# Patient Record
Sex: Male | Born: 1975
Health system: Southern US, Community
[De-identification: ages and names within clinical notes are randomized; demographics above are authoritative.]

## PROBLEM LIST (undated history)

## (undated) DIAGNOSIS — Z87442 Personal history of urinary calculi: Secondary | ICD-10-CM

## (undated) DIAGNOSIS — F419 Anxiety disorder, unspecified: Secondary | ICD-10-CM

## (undated) HISTORY — PX: NO PAST SURGERIES: SHX2092

---

## 2015-05-08 DIAGNOSIS — F410 Panic disorder [episodic paroxysmal anxiety] without agoraphobia: Secondary | ICD-10-CM | POA: Diagnosis not present

## 2015-05-08 DIAGNOSIS — Z209 Contact with and (suspected) exposure to unspecified communicable disease: Secondary | ICD-10-CM | POA: Diagnosis not present

## 2015-05-08 DIAGNOSIS — F411 Generalized anxiety disorder: Secondary | ICD-10-CM | POA: Diagnosis not present

## 2015-06-08 DIAGNOSIS — N50811 Right testicular pain: Secondary | ICD-10-CM | POA: Diagnosis not present

## 2015-06-08 DIAGNOSIS — R1030 Lower abdominal pain, unspecified: Secondary | ICD-10-CM | POA: Diagnosis not present

## 2015-06-09 ENCOUNTER — Other Ambulatory Visit: Payer: Self-pay | Admitting: Physician Assistant

## 2015-06-09 DIAGNOSIS — N50811 Right testicular pain: Secondary | ICD-10-CM

## 2015-06-09 DIAGNOSIS — F410 Panic disorder [episodic paroxysmal anxiety] without agoraphobia: Secondary | ICD-10-CM | POA: Diagnosis not present

## 2015-06-22 ENCOUNTER — Ambulatory Visit
Admission: RE | Admit: 2015-06-22 | Discharge: 2015-06-22 | Disposition: A | Discharge status: Home / Self Care | Payer: BLUE CROSS/BLUE SHIELD | Source: Ambulatory Visit | Attending: Physician Assistant | Admitting: Physician Assistant

## 2015-06-22 DIAGNOSIS — N50811 Right testicular pain: Secondary | ICD-10-CM

## 2015-06-22 DIAGNOSIS — N433 Hydrocele, unspecified: Secondary | ICD-10-CM | POA: Diagnosis not present

## 2015-07-08 DIAGNOSIS — F410 Panic disorder [episodic paroxysmal anxiety] without agoraphobia: Secondary | ICD-10-CM | POA: Diagnosis not present

## 2015-07-14 DIAGNOSIS — F410 Panic disorder [episodic paroxysmal anxiety] without agoraphobia: Secondary | ICD-10-CM | POA: Diagnosis not present

## 2015-08-04 DIAGNOSIS — L255 Unspecified contact dermatitis due to plants, except food: Secondary | ICD-10-CM | POA: Diagnosis not present

## 2015-08-12 DIAGNOSIS — F410 Panic disorder [episodic paroxysmal anxiety] without agoraphobia: Secondary | ICD-10-CM | POA: Diagnosis not present

## 2015-08-19 DIAGNOSIS — F410 Panic disorder [episodic paroxysmal anxiety] without agoraphobia: Secondary | ICD-10-CM | POA: Diagnosis not present

## 2015-08-28 DIAGNOSIS — M654 Radial styloid tenosynovitis [de Quervain]: Secondary | ICD-10-CM | POA: Diagnosis not present

## 2015-09-02 DIAGNOSIS — F410 Panic disorder [episodic paroxysmal anxiety] without agoraphobia: Secondary | ICD-10-CM | POA: Diagnosis not present

## 2015-09-30 DIAGNOSIS — F410 Panic disorder [episodic paroxysmal anxiety] without agoraphobia: Secondary | ICD-10-CM | POA: Diagnosis not present

## 2015-10-01 DIAGNOSIS — F410 Panic disorder [episodic paroxysmal anxiety] without agoraphobia: Secondary | ICD-10-CM | POA: Diagnosis not present

## 2015-10-15 DIAGNOSIS — F410 Panic disorder [episodic paroxysmal anxiety] without agoraphobia: Secondary | ICD-10-CM | POA: Diagnosis not present

## 2015-10-27 DIAGNOSIS — F410 Panic disorder [episodic paroxysmal anxiety] without agoraphobia: Secondary | ICD-10-CM | POA: Diagnosis not present

## 2015-11-06 DIAGNOSIS — M654 Radial styloid tenosynovitis [de Quervain]: Secondary | ICD-10-CM | POA: Diagnosis not present

## 2015-11-12 DIAGNOSIS — F410 Panic disorder [episodic paroxysmal anxiety] without agoraphobia: Secondary | ICD-10-CM | POA: Diagnosis not present

## 2015-12-09 DIAGNOSIS — Z23 Encounter for immunization: Secondary | ICD-10-CM | POA: Diagnosis not present

## 2015-12-16 DIAGNOSIS — F410 Panic disorder [episodic paroxysmal anxiety] without agoraphobia: Secondary | ICD-10-CM | POA: Diagnosis not present

## 2016-03-14 DIAGNOSIS — J329 Chronic sinusitis, unspecified: Secondary | ICD-10-CM | POA: Diagnosis not present

## 2016-03-14 DIAGNOSIS — E785 Hyperlipidemia, unspecified: Secondary | ICD-10-CM | POA: Diagnosis not present

## 2016-03-14 DIAGNOSIS — Z0000 Encounter for general adult medical examination without abnormal findings: Secondary | ICD-10-CM | POA: Diagnosis not present

## 2016-03-14 DIAGNOSIS — E559 Vitamin D deficiency, unspecified: Secondary | ICD-10-CM | POA: Diagnosis not present

## 2016-03-16 DIAGNOSIS — F410 Panic disorder [episodic paroxysmal anxiety] without agoraphobia: Secondary | ICD-10-CM | POA: Diagnosis not present

## 2016-04-25 DIAGNOSIS — M654 Radial styloid tenosynovitis [de Quervain]: Secondary | ICD-10-CM | POA: Diagnosis not present

## 2016-04-29 DIAGNOSIS — J329 Chronic sinusitis, unspecified: Secondary | ICD-10-CM | POA: Diagnosis not present

## 2016-06-02 DIAGNOSIS — F410 Panic disorder [episodic paroxysmal anxiety] without agoraphobia: Secondary | ICD-10-CM | POA: Diagnosis not present

## 2016-09-29 DIAGNOSIS — F410 Panic disorder [episodic paroxysmal anxiety] without agoraphobia: Secondary | ICD-10-CM | POA: Diagnosis not present

## 2016-12-13 DIAGNOSIS — Z23 Encounter for immunization: Secondary | ICD-10-CM | POA: Diagnosis not present

## 2016-12-14 DIAGNOSIS — F410 Panic disorder [episodic paroxysmal anxiety] without agoraphobia: Secondary | ICD-10-CM | POA: Diagnosis not present

## 2017-03-15 DIAGNOSIS — F410 Panic disorder [episodic paroxysmal anxiety] without agoraphobia: Secondary | ICD-10-CM | POA: Diagnosis not present

## 2017-03-28 DIAGNOSIS — Z0000 Encounter for general adult medical examination without abnormal findings: Secondary | ICD-10-CM | POA: Diagnosis not present

## 2017-03-31 DIAGNOSIS — Z131 Encounter for screening for diabetes mellitus: Secondary | ICD-10-CM | POA: Diagnosis not present

## 2017-03-31 DIAGNOSIS — E785 Hyperlipidemia, unspecified: Secondary | ICD-10-CM | POA: Diagnosis not present

## 2017-03-31 DIAGNOSIS — E559 Vitamin D deficiency, unspecified: Secondary | ICD-10-CM | POA: Diagnosis not present

## 2017-03-31 DIAGNOSIS — F411 Generalized anxiety disorder: Secondary | ICD-10-CM | POA: Diagnosis not present

## 2017-04-04 DIAGNOSIS — M654 Radial styloid tenosynovitis [de Quervain]: Secondary | ICD-10-CM | POA: Diagnosis not present

## 2017-04-11 DIAGNOSIS — F410 Panic disorder [episodic paroxysmal anxiety] without agoraphobia: Secondary | ICD-10-CM | POA: Diagnosis not present

## 2017-06-21 DIAGNOSIS — F410 Panic disorder [episodic paroxysmal anxiety] without agoraphobia: Secondary | ICD-10-CM | POA: Diagnosis not present

## 2017-07-12 DIAGNOSIS — F410 Panic disorder [episodic paroxysmal anxiety] without agoraphobia: Secondary | ICD-10-CM | POA: Diagnosis not present

## 2017-08-09 DIAGNOSIS — F410 Panic disorder [episodic paroxysmal anxiety] without agoraphobia: Secondary | ICD-10-CM | POA: Diagnosis not present

## 2017-09-28 DIAGNOSIS — R109 Unspecified abdominal pain: Secondary | ICD-10-CM | POA: Diagnosis not present

## 2017-10-04 DIAGNOSIS — F410 Panic disorder [episodic paroxysmal anxiety] without agoraphobia: Secondary | ICD-10-CM | POA: Diagnosis not present

## 2017-10-11 DIAGNOSIS — F410 Panic disorder [episodic paroxysmal anxiety] without agoraphobia: Secondary | ICD-10-CM | POA: Diagnosis not present

## 2017-10-23 DIAGNOSIS — L659 Nonscarring hair loss, unspecified: Secondary | ICD-10-CM | POA: Diagnosis not present

## 2017-10-23 DIAGNOSIS — R739 Hyperglycemia, unspecified: Secondary | ICD-10-CM | POA: Diagnosis not present

## 2017-10-23 DIAGNOSIS — F410 Panic disorder [episodic paroxysmal anxiety] without agoraphobia: Secondary | ICD-10-CM | POA: Diagnosis not present

## 2017-10-25 DIAGNOSIS — F410 Panic disorder [episodic paroxysmal anxiety] without agoraphobia: Secondary | ICD-10-CM | POA: Diagnosis not present

## 2017-11-07 DIAGNOSIS — N202 Calculus of kidney with calculus of ureter: Secondary | ICD-10-CM | POA: Diagnosis not present

## 2017-11-07 DIAGNOSIS — R310 Gross hematuria: Secondary | ICD-10-CM | POA: Diagnosis not present

## 2017-11-08 DIAGNOSIS — F410 Panic disorder [episodic paroxysmal anxiety] without agoraphobia: Secondary | ICD-10-CM | POA: Diagnosis not present

## 2017-11-13 DIAGNOSIS — Z23 Encounter for immunization: Secondary | ICD-10-CM | POA: Diagnosis not present

## 2017-11-22 DIAGNOSIS — F410 Panic disorder [episodic paroxysmal anxiety] without agoraphobia: Secondary | ICD-10-CM | POA: Diagnosis not present

## 2017-12-07 DIAGNOSIS — F410 Panic disorder [episodic paroxysmal anxiety] without agoraphobia: Secondary | ICD-10-CM | POA: Diagnosis not present

## 2017-12-13 DIAGNOSIS — F410 Panic disorder [episodic paroxysmal anxiety] without agoraphobia: Secondary | ICD-10-CM | POA: Diagnosis not present

## 2017-12-14 DIAGNOSIS — N202 Calculus of kidney with calculus of ureter: Secondary | ICD-10-CM | POA: Diagnosis not present

## 2018-01-02 ENCOUNTER — Other Ambulatory Visit: Payer: Self-pay | Admitting: Urology

## 2018-01-02 ENCOUNTER — Encounter (HOSPITAL_COMMUNITY): Payer: Self-pay | Admitting: *Deleted

## 2018-01-02 DIAGNOSIS — N202 Calculus of kidney with calculus of ureter: Secondary | ICD-10-CM | POA: Diagnosis not present

## 2018-01-05 NOTE — H&P (Signed)
HPI: Mark Petersen is a 43 year-old male established patient who is here for a left ureteral calculus.  The patient's stone was on his left side. He first noticed the symptoms 09/27/2017. This is not his first kidney stone. He is currently having flank pain. He denies having back pain, groin pain, nausea, vomiting, fever, and chills. He does not have a burning sensation when he urinates. He has not caught a stone in his urine strainer since his symptoms began.   He has never had surgical treatment for calculi in the past. This condition would be considered of mild to moderate severity with no modifying factors or associated signs or symptoms other than as noted above.   Calculus disease: He has a history of stones having passed about 7 or 8 over his lifetime.   12/14/17: After experiencing left flank pain and noting gross hematuria he was seen and evaluated with a CT scan which revealed a 5 mm left ureteral stone at the L3 level. Having passed stones previously elected to proceed with medical expulsive therapy and was started on tamsulosin. He returns today for follow-up imaging. He has not had any flank pain, hematuria and has not seen his stone pass.   01/02/18: He came in today and reports that after he gave it thought he elected to proceed with lithotripsy and it was scheduled but then he said he "freaked out" and canceled. He he now reports that he has been having intermittent pain primarily at night in the left flank. This is not been associated with any hematuria or change in his voiding pattern.     ALLERGIES: Nkda    MEDICATIONS: Tramadol Hcl 50 mg tablet     GU PSH: None   NON-GU PSH: None   GU PMH: Ureteral calculus (Improving), Left, He has elected to consider possible lithotripsy and told me he would contact me if he elected to proceed with that. Otherwise his stone has now moved back into the kidney and is in the lower pole. It has Hounsfield units of 520 he would likely  respond to lithotripsy. - 12/14/2017, Left, He is currently passing a left ureteral stone. We are going to proceed with medical expulsive therapy. If he develops difficulty he will return sooner but he said in the past his stones have typically past within either a week to sometimes 2-3 months. I am going to have him back in 4 weeks for a KUB., - 11/07/2017 Flank Pain (Acute), Left, His left flank pain is due to a stone in the mid ureter. He has no right ureteral calculi. - 11/07/2017 Gross hematuria, His gross hematuria is clearly secondary to a left ureteral stone. - 11/07/2017 Renal calculus, Bilateral, He has bilateral punctate renal calculi. They are nonobstructing. - 11/07/2017    NON-GU PMH: Anxiety Depression    FAMILY HISTORY: 1 son - Son Hypertension - Father stroke - Father   SOCIAL HISTORY: Marital Status: Married Preferred Language: English; Ethnicity: Not Hispanic Or Latino; Race: White Current Smoking Status: Patient has never smoked.   Tobacco Use Assessment Completed: Used Tobacco in last 30 days? Has never drank.  Drinks 3 caffeinated drinks per day.    REVIEW OF SYSTEMS:    GU Review Male:   Patient denies frequent urination, hard to postpone urination, burning/ pain with urination, get up at night to urinate, leakage of urine, stream starts and stops, trouble starting your stream, have to strain to urinate , erection problems, and penile pain.  Gastrointestinal (Upper):  Patient denies nausea, vomiting, and indigestion/ heartburn.  Gastrointestinal (Lower):   Patient denies diarrhea and constipation.  Constitutional:   Patient denies fever, night sweats, weight loss, and fatigue.  Skin:   Patient denies skin rash/ lesion and itching.  Eyes:   Patient denies blurred vision and double vision.  Ears/ Nose/ Throat:   Patient denies sore throat and sinus problems.  Hematologic/Lymphatic:   Patient denies swollen glands and easy bruising.  Cardiovascular:   Patient denies  leg swelling and chest pains.  Respiratory:   Patient denies cough and shortness of breath.  Endocrine:   Patient denies excessive thirst.  Musculoskeletal:   Patient denies back pain and joint pain.  Neurological:   Patient denies headaches and dizziness.  Psychologic:   Patient denies depression and anxiety.   VITAL SIGNS:    Weight 206 lb / 93.44 kg  Height 69 in / 175.26 cm  BP 134/79 mmHg  Pulse 61 /min  BMI 30.4 kg/m   MULTI-SYSTEM PHYSICAL EXAMINATION:    Constitutional: Well-nourished. No physical deformities. Normally developed. Good grooming.  Neck: Neck symmetrical, not swollen. Normal tracheal position.  Respiratory: No labored breathing, no use of accessory muscles.   Cardiovascular: Normal temperature, normal extremity pulses, no swelling, no varicosities.  Lymphatic: No enlargement of neck, axillae, groin.  Skin: No paleness, no jaundice, no cyanosis. No lesion, no ulcer, no rash.  Neurologic / Psychiatric: Oriented to time, oriented to place, oriented to person. No depression, no anxiety, no agitation.  Gastrointestinal: No mass, no tenderness, no rigidity, non obese abdomen.  Eyes: Normal conjunctivae. Normal eyelids.  Ears, Nose, Mouth, and Throat: Left ear no scars, no lesions, no masses. Right ear no scars, no lesions, no masses. Nose no scars, no lesions, no masses. Normal hearing. Normal lips.  Musculoskeletal: Normal gait and station of head and neck.    PAST DATA REVIEWED:  Source Of History:  Patient  Records Review:   Previous Patient Records  X-Ray Review: KUB: Reviewed Films. Previous KUB images were reviewed and compared with today's study.    PROCEDURES:         KUB - [id number]  A single view of the abdomen is obtained.      His stone has passed back into the left ureter. It is currently at the junction of the L3/L4 vertebral bodies. The location was marked on the KUB.         Urinalysis Dipstick Dipstick Cont'd  Color: Yellow Bilirubin: Neg  mg/dL  Appearance: Clear Ketones: Neg mg/dL  Specific Gravity: <=2.956 Blood: Neg ery/uL  pH: 5.5 Protein: Neg mg/dL  Glucose: Neg mg/dL Urobilinogen: 0.2 mg/dL    Nitrites: Neg    Leukocyte Esterase: Neg leu/uL    ASSESSMENT/PLAN:  We discussed the management of urinary stones. These options include observation, ureteroscopy, shockwave lithotripsy, and PCNL. We discussed which options are relevant to these particular stones. We discussed the natural history of stones as well as the complications of untreated stones and the impact on quality of life without treatment as well as with each of the above listed treatments. We also discussed the efficacy of each treatment in its ability to clear the stone burden. With any of these management options I discussed the signs and symptoms of infection and the need for emergent treatment should these be experienced. For each option we discussed the ability of each procedure to clear the patient of their stone burden.   For observation I described the risks which include but  are not limited to silent renal damage, life-threatening infection, need for emergent surgery, failure to pass stone, and pain.   For ureteroscopy I described the risks which include heart attack, stroke, pulmonary embolus, death, bleeding, infection, damage to contiguous structures, positioning injury, ureteral stricture, ureteral avulsion, ureteral injury, need for ureteral stent, inability to perform ureteroscopy, need for an interval procedure, inability to clear stone burden, stent discomfort and pain.   For shockwave lithotripsy I described the risks which include arrhythmia, kidney contusion, kidney hemorrhage, need for transfusion, long-term risk of diabetes or hypertension, back discomfort, flank ecchymosis, flank abrasion, inability to break up stone, inability to pass stone fragments, Steinstrasse, infection associated with obstructing stones, need for different surgical  procedure, need for repeat shockwave lithotripsy, and death.       ICD-10 Details  1 GU:   Ureteral calculus - N20.1 Left, His stone is back in his left ureter. We discussed proceeding with lithotripsy and he had concerns that I have addressed today and he has now elected to proceed.   2   Renal calculus - N20.0 Bilateral, Stable - I cannot discern his bilateral, punctate renal calculi.

## 2018-01-05 NOTE — Discharge Instructions (Signed)

## 2018-01-08 ENCOUNTER — Ambulatory Visit (HOSPITAL_COMMUNITY)
Admission: RE | Admit: 2018-01-08 | Discharge: 2018-01-08 | Disposition: A | Discharge status: Home / Self Care | Payer: BLUE CROSS/BLUE SHIELD | Attending: Urology | Admitting: Urology

## 2018-01-08 ENCOUNTER — Encounter (HOSPITAL_COMMUNITY): Payer: Self-pay | Admitting: General Practice

## 2018-01-08 ENCOUNTER — Encounter (HOSPITAL_COMMUNITY)
Admission: RE | Disposition: A | Discharge status: Home / Self Care | Payer: Self-pay | Source: Home / Self Care | Attending: Urology

## 2018-01-08 ENCOUNTER — Ambulatory Visit (HOSPITAL_COMMUNITY): Payer: BLUE CROSS/BLUE SHIELD

## 2018-01-08 DIAGNOSIS — Z79899 Other long term (current) drug therapy: Secondary | ICD-10-CM | POA: Diagnosis not present

## 2018-01-08 DIAGNOSIS — N201 Calculus of ureter: Secondary | ICD-10-CM | POA: Diagnosis not present

## 2018-01-08 DIAGNOSIS — F419 Anxiety disorder, unspecified: Secondary | ICD-10-CM | POA: Insufficient documentation

## 2018-01-08 DIAGNOSIS — N200 Calculus of kidney: Secondary | ICD-10-CM

## 2018-01-08 DIAGNOSIS — F329 Major depressive disorder, single episode, unspecified: Secondary | ICD-10-CM | POA: Diagnosis not present

## 2018-01-08 DIAGNOSIS — N202 Calculus of kidney with calculus of ureter: Secondary | ICD-10-CM | POA: Diagnosis not present

## 2018-01-08 HISTORY — DX: Anxiety disorder, unspecified: F41.9

## 2018-01-08 HISTORY — PX: EXTRACORPOREAL SHOCK WAVE LITHOTRIPSY: SHX1557

## 2018-01-08 HISTORY — DX: Personal history of urinary calculi: Z87.442

## 2018-01-08 SURGERY — LITHOTRIPSY, ESWL
Anesthesia: LOCAL | Laterality: Left

## 2018-01-08 MED ORDER — TAMSULOSIN HCL 0.4 MG PO CAPS: 0.4000 mg | ORAL_CAPSULE | ORAL | 0 refills | Status: AC

## 2018-01-08 MED ORDER — CIPROFLOXACIN HCL 500 MG PO TABS
500.0000 mg | ORAL_TABLET | ORAL | Status: AC
  Administered 2018-01-08: 500 mg via ORAL
  Filled 2018-01-08: qty 1

## 2018-01-08 MED ORDER — SODIUM CHLORIDE 0.9 % IV SOLN
INTRAVENOUS | Status: DC
  Administered 2018-01-08: 08:00:00 via INTRAVENOUS

## 2018-01-08 MED ORDER — HYDROCODONE-ACETAMINOPHEN 10-325 MG PO TABS: 1.0000 | ORAL_TABLET | ORAL | 0 refills | Status: AC | PRN

## 2018-01-08 MED ORDER — DIPHENHYDRAMINE HCL 25 MG PO CAPS
25.0000 mg | ORAL_CAPSULE | ORAL | Status: AC
  Administered 2018-01-08: 25 mg via ORAL
  Filled 2018-01-08: qty 1

## 2018-01-08 MED ORDER — DIAZEPAM 5 MG PO TABS
10.0000 mg | ORAL_TABLET | ORAL | Status: AC
  Administered 2018-01-08: 10 mg via ORAL
  Filled 2018-01-08: qty 2

## 2018-01-08 NOTE — Op Note (Signed)
See [facility] OP note scanned into chart. Also because of the size, density, location and other factors that cannot be anticipated I feel this will likely be a staged procedure. This fact supersedes any indication in the scanned [facility] operative note to the contrary.  

## 2018-01-09 ENCOUNTER — Encounter (HOSPITAL_COMMUNITY): Payer: Self-pay | Admitting: Urology

## 2018-01-16 DIAGNOSIS — F410 Panic disorder [episodic paroxysmal anxiety] without agoraphobia: Secondary | ICD-10-CM | POA: Diagnosis not present

## 2018-01-16 DIAGNOSIS — R739 Hyperglycemia, unspecified: Secondary | ICD-10-CM | POA: Diagnosis not present

## 2018-01-16 DIAGNOSIS — N5312 Painful ejaculation: Secondary | ICD-10-CM | POA: Diagnosis not present

## 2018-01-17 DIAGNOSIS — F410 Panic disorder [episodic paroxysmal anxiety] without agoraphobia: Secondary | ICD-10-CM | POA: Diagnosis not present

## 2018-01-22 DIAGNOSIS — N201 Calculus of ureter: Secondary | ICD-10-CM | POA: Diagnosis not present

## 2018-01-22 DIAGNOSIS — N202 Calculus of kidney with calculus of ureter: Secondary | ICD-10-CM | POA: Diagnosis not present

## 2018-02-06 DIAGNOSIS — F410 Panic disorder [episodic paroxysmal anxiety] without agoraphobia: Secondary | ICD-10-CM | POA: Diagnosis not present

## 2018-02-13 DIAGNOSIS — F410 Panic disorder [episodic paroxysmal anxiety] without agoraphobia: Secondary | ICD-10-CM | POA: Diagnosis not present

## 2018-02-20 DIAGNOSIS — F410 Panic disorder [episodic paroxysmal anxiety] without agoraphobia: Secondary | ICD-10-CM | POA: Diagnosis not present

## 2018-02-28 DIAGNOSIS — F410 Panic disorder [episodic paroxysmal anxiety] without agoraphobia: Secondary | ICD-10-CM | POA: Diagnosis not present

## 2018-04-10 DIAGNOSIS — Z0000 Encounter for general adult medical examination without abnormal findings: Secondary | ICD-10-CM | POA: Diagnosis not present

## 2018-04-26 DIAGNOSIS — F410 Panic disorder [episodic paroxysmal anxiety] without agoraphobia: Secondary | ICD-10-CM | POA: Diagnosis not present

## 2018-05-09 DIAGNOSIS — F410 Panic disorder [episodic paroxysmal anxiety] without agoraphobia: Secondary | ICD-10-CM | POA: Diagnosis not present

## 2018-05-25 DIAGNOSIS — M67961 Unspecified disorder of synovium and tendon, right lower leg: Secondary | ICD-10-CM | POA: Diagnosis not present

## 2018-06-04 DIAGNOSIS — F411 Generalized anxiety disorder: Secondary | ICD-10-CM | POA: Diagnosis not present

## 2018-06-04 DIAGNOSIS — L989 Disorder of the skin and subcutaneous tissue, unspecified: Secondary | ICD-10-CM | POA: Diagnosis not present

## 2018-06-07 DIAGNOSIS — M67969 Unspecified disorder of synovium and tendon, unspecified lower leg: Secondary | ICD-10-CM | POA: Diagnosis not present

## 2018-06-07 DIAGNOSIS — L821 Other seborrheic keratosis: Secondary | ICD-10-CM | POA: Diagnosis not present

## 2018-06-07 DIAGNOSIS — D2372 Other benign neoplasm of skin of left lower limb, including hip: Secondary | ICD-10-CM | POA: Diagnosis not present

## 2018-06-07 DIAGNOSIS — D485 Neoplasm of uncertain behavior of skin: Secondary | ICD-10-CM | POA: Diagnosis not present

## 2018-08-08 DIAGNOSIS — F410 Panic disorder [episodic paroxysmal anxiety] without agoraphobia: Secondary | ICD-10-CM | POA: Diagnosis not present

## 2018-10-09 DIAGNOSIS — F410 Panic disorder [episodic paroxysmal anxiety] without agoraphobia: Secondary | ICD-10-CM | POA: Diagnosis not present

## 2018-11-30 DIAGNOSIS — H6091 Unspecified otitis externa, right ear: Secondary | ICD-10-CM | POA: Diagnosis not present

## 2018-12-06 DIAGNOSIS — F410 Panic disorder [episodic paroxysmal anxiety] without agoraphobia: Secondary | ICD-10-CM | POA: Diagnosis not present

## 2019-01-10 DIAGNOSIS — D2339 Other benign neoplasm of skin of other parts of face: Secondary | ICD-10-CM | POA: Diagnosis not present

## 2019-01-10 DIAGNOSIS — I781 Nevus, non-neoplastic: Secondary | ICD-10-CM | POA: Diagnosis not present

## 2019-01-10 DIAGNOSIS — L821 Other seborrheic keratosis: Secondary | ICD-10-CM | POA: Diagnosis not present

## 2019-01-17 DIAGNOSIS — M67961 Unspecified disorder of synovium and tendon, right lower leg: Secondary | ICD-10-CM | POA: Diagnosis not present

## 2019-02-13 DIAGNOSIS — M67961 Unspecified disorder of synovium and tendon, right lower leg: Secondary | ICD-10-CM | POA: Diagnosis not present

## 2019-03-07 DIAGNOSIS — F410 Panic disorder [episodic paroxysmal anxiety] without agoraphobia: Secondary | ICD-10-CM | POA: Diagnosis not present

## 2019-04-18 DIAGNOSIS — Z0000 Encounter for general adult medical examination without abnormal findings: Secondary | ICD-10-CM | POA: Diagnosis not present

## 2019-04-18 DIAGNOSIS — Z6830 Body mass index (BMI) 30.0-30.9, adult: Secondary | ICD-10-CM | POA: Diagnosis not present

## 2019-04-18 DIAGNOSIS — L649 Androgenic alopecia, unspecified: Secondary | ICD-10-CM | POA: Diagnosis not present

## 2019-04-18 DIAGNOSIS — E785 Hyperlipidemia, unspecified: Secondary | ICD-10-CM | POA: Diagnosis not present

## 2019-04-18 DIAGNOSIS — E559 Vitamin D deficiency, unspecified: Secondary | ICD-10-CM | POA: Diagnosis not present

## 2019-04-25 ENCOUNTER — Ambulatory Visit: Payer: Self-pay | Attending: Internal Medicine

## 2019-04-25 DIAGNOSIS — Z23 Encounter for immunization: Secondary | ICD-10-CM

## 2019-04-25 NOTE — Progress Notes (Signed)
   Covid-19 Vaccination Clinic  Name:  Mark Petersen    MRN: [id number] DOB: 1975-03-13  04/25/2019  [name] was observed post Covid-19 immunization for 15 minutes without incident. He was provided with Vaccine Information Sheet and instruction to access the V-Safe system.   [name] was instructed to call 911 with any severe reactions post vaccine: Marland Kitchen Difficulty breathing  . Swelling of face and throat  . A fast heartbeat  . A bad rash all over body  . Dizziness and weakness   Immunizations Administered    Name Date Dose VIS Date Route   Pfizer COVID-19 Vaccine 04/25/2019 11:07 AM 0.3 mL 02/27/2018 Intramuscular   Manufacturer: [employer], [employer]   Lot: [id number]   NDC: [address]-3

## 2019-05-20 ENCOUNTER — Ambulatory Visit: Payer: Self-pay | Attending: Internal Medicine

## 2019-05-20 DIAGNOSIS — Z23 Encounter for immunization: Secondary | ICD-10-CM

## 2019-05-20 NOTE — Progress Notes (Signed)
   Covid-19 Vaccination Clinic  Name:  Jaquille Kau    MRN: [id number] DOB: April 13, 1975  05/20/2019  Mr. Pilar was observed post Covid-19 immunization for 15 minutes without incident. He was provided with Vaccine Information Sheet and instruction to access the V-Safe system.   Mr. Depuy was instructed to call 911 with any severe reactions post vaccine: Marland Kitchen Difficulty breathing  . Swelling of face and throat  . A fast heartbeat  . A bad rash all over body  . Dizziness and weakness   Immunizations Administered    Name Date Dose VIS Date Route   Pfizer COVID-19 Vaccine 05/20/2019  8:16 AM 0.3 mL 02/27/2018 Intramuscular   Manufacturer: [employer], [employer]   Lot: [id number]   NDC: [address]-5

## 2019-05-30 DIAGNOSIS — F410 Panic disorder [episodic paroxysmal anxiety] without agoraphobia: Secondary | ICD-10-CM | POA: Diagnosis not present

## 2019-07-05 IMAGING — DX DG ABDOMEN 1V
2 series · 2 of 2 positions shown · non-contrast
Comparison: CT of the abdomen and pelvis 11/07/2017. Abdominal
radiographs 12/14/2017 and 01/02/2018.

CLINICAL DATA: Left renal calculus.

EXAM:
ABDOMEN - 1 VIEW

[abdomen kub (1 of 2)]
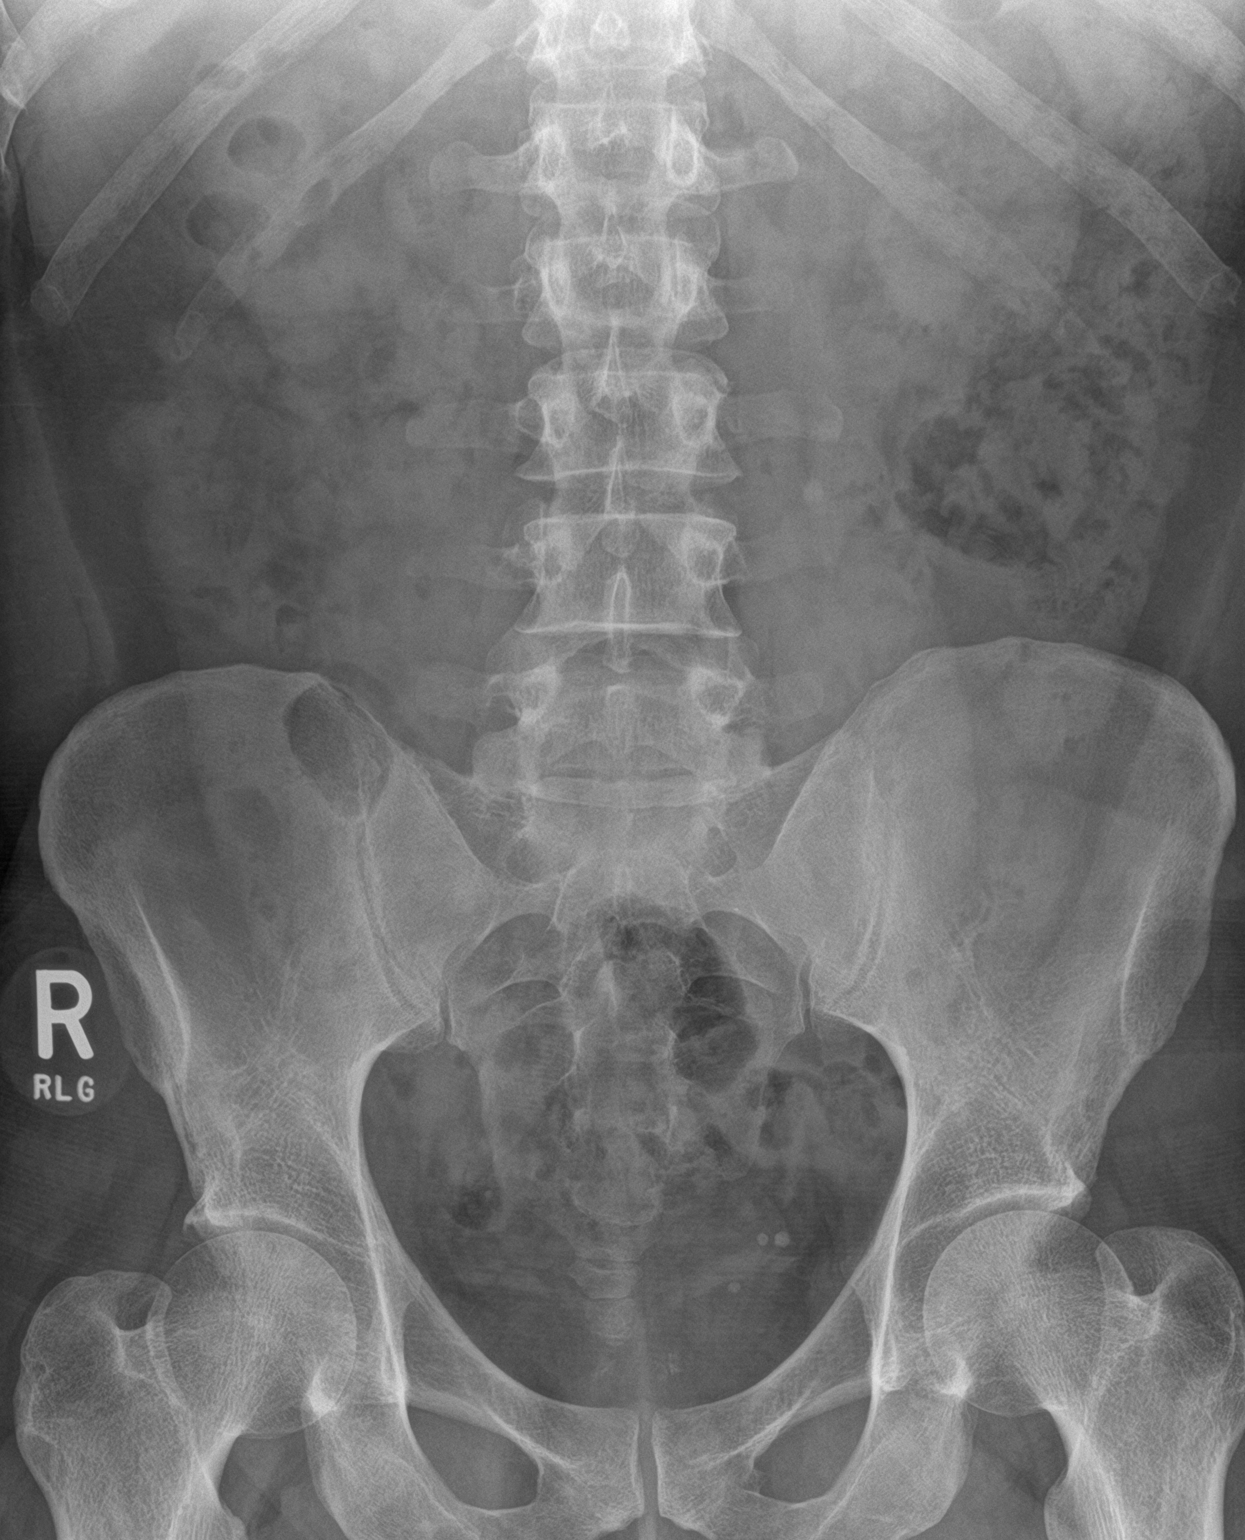

[abdomen kub (2 of 2)]
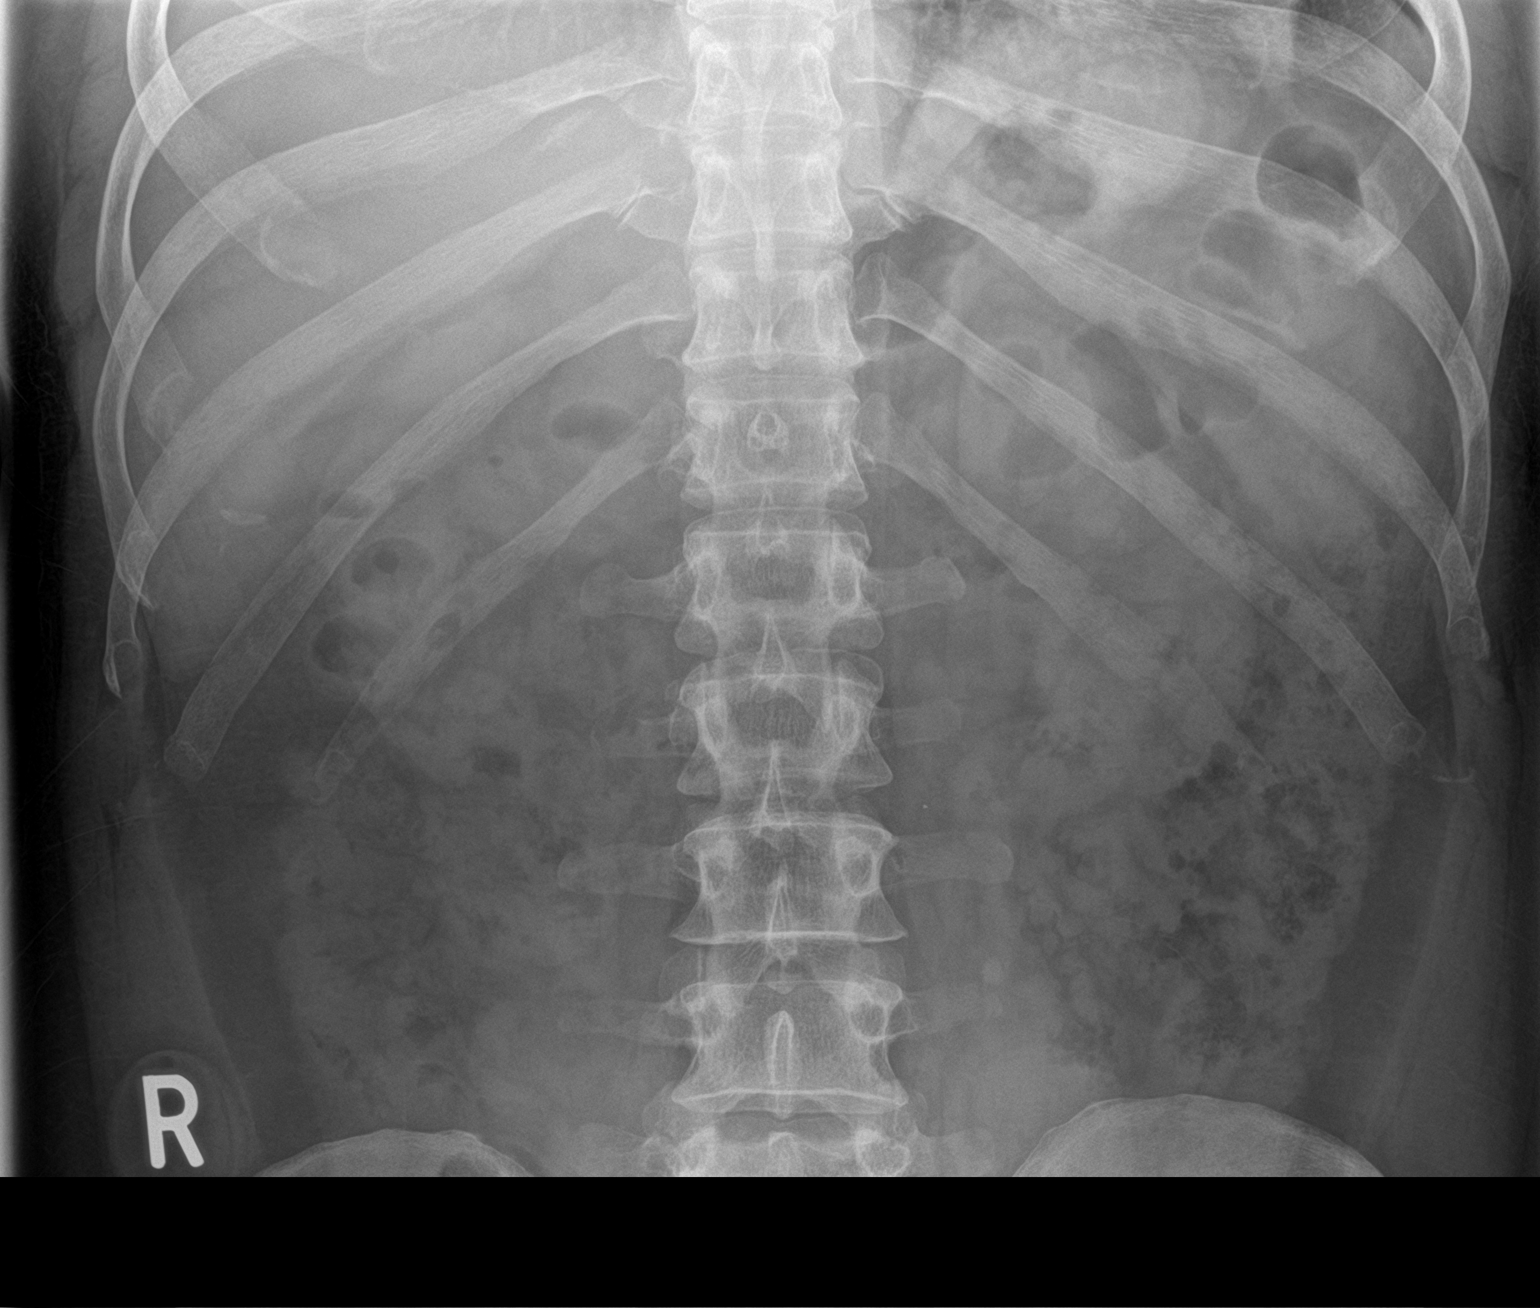

[2 of 2 positions shown; findings below may reference images not displayed]

FINDINGS: A 6 mm stone noted in the left ureter is stable in position, at the
L3-4 disc space. No new stones are present. Pelvic phleboliths are
again seen.

Bowel gas pattern is normal. Axial skeleton is within normal limits.
IMPRESSION: 1. Stable position of mid left ureteral stone.
2. Normal bowel gas pattern.

## 2019-08-04 DIAGNOSIS — Z23 Encounter for immunization: Secondary | ICD-10-CM | POA: Diagnosis not present

## 2019-08-04 DIAGNOSIS — S61002A Unspecified open wound of left thumb without damage to nail, initial encounter: Secondary | ICD-10-CM | POA: Diagnosis not present

## 2019-08-14 DIAGNOSIS — H524 Presbyopia: Secondary | ICD-10-CM | POA: Diagnosis not present

## 2019-08-27 DIAGNOSIS — F410 Panic disorder [episodic paroxysmal anxiety] without agoraphobia: Secondary | ICD-10-CM | POA: Diagnosis not present

## 2019-10-09 DIAGNOSIS — M67961 Unspecified disorder of synovium and tendon, right lower leg: Secondary | ICD-10-CM | POA: Diagnosis not present

## 2019-11-20 DIAGNOSIS — F410 Panic disorder [episodic paroxysmal anxiety] without agoraphobia: Secondary | ICD-10-CM | POA: Diagnosis not present

## 2020-02-18 DIAGNOSIS — F410 Panic disorder [episodic paroxysmal anxiety] without agoraphobia: Secondary | ICD-10-CM | POA: Diagnosis not present

## 2020-04-27 DIAGNOSIS — Z0000 Encounter for general adult medical examination without abnormal findings: Secondary | ICD-10-CM | POA: Diagnosis not present

## 2020-05-28 DIAGNOSIS — H60331 Swimmer's ear, right ear: Secondary | ICD-10-CM | POA: Diagnosis not present

## 2020-06-03 DIAGNOSIS — F410 Panic disorder [episodic paroxysmal anxiety] without agoraphobia: Secondary | ICD-10-CM | POA: Diagnosis not present

## 2020-07-20 DIAGNOSIS — Z0000 Encounter for general adult medical examination without abnormal findings: Secondary | ICD-10-CM | POA: Diagnosis not present

## 2020-07-20 DIAGNOSIS — Z13220 Encounter for screening for lipoid disorders: Secondary | ICD-10-CM | POA: Diagnosis not present

## 2020-07-20 DIAGNOSIS — E559 Vitamin D deficiency, unspecified: Secondary | ICD-10-CM | POA: Diagnosis not present

## 2020-09-21 DIAGNOSIS — F410 Panic disorder [episodic paroxysmal anxiety] without agoraphobia: Secondary | ICD-10-CM | POA: Diagnosis not present

## 2020-12-14 DIAGNOSIS — F410 Panic disorder [episodic paroxysmal anxiety] without agoraphobia: Secondary | ICD-10-CM | POA: Diagnosis not present

## 2020-12-22 DIAGNOSIS — J328 Other chronic sinusitis: Secondary | ICD-10-CM | POA: Diagnosis not present

## 2020-12-22 DIAGNOSIS — J342 Deviated nasal septum: Secondary | ICD-10-CM | POA: Diagnosis not present

## 2020-12-22 DIAGNOSIS — J343 Hypertrophy of nasal turbinates: Secondary | ICD-10-CM | POA: Diagnosis not present

## 2020-12-22 DIAGNOSIS — J329 Chronic sinusitis, unspecified: Secondary | ICD-10-CM | POA: Diagnosis not present

## 2021-03-08 DIAGNOSIS — F410 Panic disorder [episodic paroxysmal anxiety] without agoraphobia: Secondary | ICD-10-CM | POA: Diagnosis not present

## 2021-03-19 DIAGNOSIS — J343 Hypertrophy of nasal turbinates: Secondary | ICD-10-CM | POA: Diagnosis not present

## 2021-03-19 DIAGNOSIS — K089 Disorder of teeth and supporting structures, unspecified: Secondary | ICD-10-CM | POA: Diagnosis not present

## 2021-03-19 DIAGNOSIS — J342 Deviated nasal septum: Secondary | ICD-10-CM | POA: Diagnosis not present

## 2021-03-19 DIAGNOSIS — R300 Dysuria: Secondary | ICD-10-CM | POA: Diagnosis not present

## 2021-03-19 DIAGNOSIS — J328 Other chronic sinusitis: Secondary | ICD-10-CM | POA: Diagnosis not present

## 2021-03-25 DIAGNOSIS — R300 Dysuria: Secondary | ICD-10-CM | POA: Diagnosis not present

## 2021-03-25 DIAGNOSIS — N200 Calculus of kidney: Secondary | ICD-10-CM | POA: Diagnosis not present

## 2021-05-10 DIAGNOSIS — E559 Vitamin D deficiency, unspecified: Secondary | ICD-10-CM | POA: Diagnosis not present

## 2021-05-10 DIAGNOSIS — Z0000 Encounter for general adult medical examination without abnormal findings: Secondary | ICD-10-CM | POA: Diagnosis not present

## 2021-05-10 DIAGNOSIS — Z131 Encounter for screening for diabetes mellitus: Secondary | ICD-10-CM | POA: Diagnosis not present

## 2021-05-10 DIAGNOSIS — Z13220 Encounter for screening for lipoid disorders: Secondary | ICD-10-CM | POA: Diagnosis not present

## 2021-06-02 DIAGNOSIS — F410 Panic disorder [episodic paroxysmal anxiety] without agoraphobia: Secondary | ICD-10-CM | POA: Diagnosis not present

## 2021-06-14 DIAGNOSIS — N130 Hydronephrosis with ureteropelvic junction obstruction: Secondary | ICD-10-CM | POA: Diagnosis not present

## 2021-06-22 DIAGNOSIS — N130 Hydronephrosis with ureteropelvic junction obstruction: Secondary | ICD-10-CM | POA: Diagnosis not present

## 2021-06-23 DIAGNOSIS — E6609 Other obesity due to excess calories: Secondary | ICD-10-CM | POA: Diagnosis not present

## 2021-08-19 DIAGNOSIS — H524 Presbyopia: Secondary | ICD-10-CM | POA: Diagnosis not present

## 2021-10-08 DIAGNOSIS — G4700 Insomnia, unspecified: Secondary | ICD-10-CM | POA: Diagnosis not present
